# Patient Record
Sex: Male | Born: 1965 | Race: Asian | Hispanic: No | Marital: Married
Health system: Southern US, Community
[De-identification: ages and names within clinical notes are randomized; demographics above are authoritative.]

---

## 2020-05-02 ENCOUNTER — Emergency Department (HOSPITAL_COMMUNITY): Payer: 59

## 2020-05-02 ENCOUNTER — Emergency Department (HOSPITAL_COMMUNITY)
Admission: EM | Admit: 2020-05-02 | Discharge: 2020-05-02 | Disposition: A | Discharge status: Home / Self Care | Payer: 59 | Attending: Emergency Medicine | Admitting: Emergency Medicine

## 2020-05-02 ENCOUNTER — Encounter (HOSPITAL_COMMUNITY): Payer: Self-pay | Admitting: Emergency Medicine

## 2020-05-02 DIAGNOSIS — R1110 Vomiting, unspecified: Secondary | ICD-10-CM | POA: Diagnosis not present

## 2020-05-02 DIAGNOSIS — R197 Diarrhea, unspecified: Secondary | ICD-10-CM | POA: Diagnosis not present

## 2020-05-02 DIAGNOSIS — R1033 Periumbilical pain: Secondary | ICD-10-CM | POA: Diagnosis not present

## 2020-05-02 LAB — URINALYSIS, ROUTINE W REFLEX MICROSCOPIC
Bilirubin Urine: NEGATIVE
Glucose, UA: NEGATIVE mg/dL
Hgb urine dipstick: NEGATIVE
Ketones, ur: NEGATIVE mg/dL
Leukocytes,Ua: NEGATIVE
Nitrite: NEGATIVE
Protein, ur: NEGATIVE mg/dL
Specific Gravity, Urine: 1.023 (ref 1.005–1.030)
pH: 6 (ref 5.0–8.0)

## 2020-05-02 LAB — CBC
HCT: 43 % (ref 39.0–52.0)
Hemoglobin: 14.6 g/dL (ref 13.0–17.0)
MCH: 31.3 pg (ref 26.0–34.0)
MCHC: 34 g/dL (ref 30.0–36.0)
MCV: 92.1 fL (ref 80.0–100.0)
Platelets: 226 10*3/uL (ref 150–400)
RBC: 4.67 MIL/uL (ref 4.22–5.81)
RDW: 11.6 % (ref 11.5–15.5)
WBC: 7.4 10*3/uL (ref 4.0–10.5)
nRBC: 0 % (ref 0.0–0.2)

## 2020-05-02 LAB — COMPREHENSIVE METABOLIC PANEL
ALT: 19 U/L (ref 0–44)
AST: 20 U/L (ref 15–41)
Albumin: 4.4 g/dL (ref 3.5–5.0)
Alkaline Phosphatase: 56 U/L (ref 38–126)
Anion gap: 8 (ref 5–15)
BUN: 10 mg/dL (ref 6–20)
CO2: 24 mmol/L (ref 22–32)
Calcium: 9.1 mg/dL (ref 8.9–10.3)
Chloride: 106 mmol/L (ref 98–111)
Creatinine, Ser: 0.92 mg/dL (ref 0.61–1.24)
GFR calc Af Amer: >60 mL/min (ref >60)
GFR calc non Af Amer: >60 mL/min (ref >60)
Glucose, Bld: 144 mg/dL — ABNORMAL HIGH (ref 70–99)
Potassium: 4.4 mmol/L (ref 3.5–5.1)
Sodium: 138 mmol/L (ref 135–145)
Total Bilirubin: 1.1 mg/dL (ref 0.3–1.2)
Total Protein: 7 g/dL (ref 6.5–8.1)

## 2020-05-02 LAB — LIPASE, BLOOD: Lipase: 25 U/L (ref 11–51)

## 2020-05-02 MED ORDER — IOHEXOL 300 MG/ML  SOLN
100.0000 mL | Freq: Once | INTRAMUSCULAR | Status: AC | PRN
  Administered 2020-05-02: 100 mL via INTRAVENOUS

## 2020-05-02 MED ORDER — DICYCLOMINE HCL 10 MG PO CAPS
10.0000 mg | ORAL_CAPSULE | Freq: Once | ORAL | Status: AC
  Administered 2020-05-02: 10 mg via ORAL
  Filled 2020-05-02: qty 1

## 2020-05-02 MED ORDER — DICYCLOMINE HCL 20 MG PO TABS: 20.0000 mg | ORAL_TABLET | Freq: Two times a day (BID) | ORAL | 0 refills | Status: AC | PRN

## 2020-05-02 MED ORDER — ONDANSETRON HCL 4 MG/2ML IJ SOLN
4.0000 mg | Freq: Once | INTRAMUSCULAR | Status: AC
  Administered 2020-05-02: 4 mg via INTRAVENOUS
  Filled 2020-05-02: qty 2

## 2020-05-02 NOTE — ED Triage Notes (Signed)
Pt reports lower abd pain with diarrhea, onset this am, denies vomiting or recent sick contacts.

## 2020-05-02 NOTE — ED Notes (Signed)
Patient transported to CT 

## 2020-05-02 NOTE — ED Provider Notes (Signed)
[facility] EMERGENCY DEPARTMENT Provider Note   CSN: [id number] Arrival date & time: 05/02/20  4128     History Chief Complaint  Patient presents with  . Abdominal Pain    Jason Kane is a 54 y.o. male presenting for evaluation of abdominal pain, diarrhea, and vomiting.  Patient states he developed lower abdominal pain last night around midnight.  He had several episodes of diarrhea, 5-10 throughout the course of the night without improvement of the abdominal pain.  He denies blood in his stool.  His stool is not black.  He reports one mild episode of emesis, however denies nausea.  He denies fevers, chills, chest pain, shortness breath, cough, urinary symptoms.  He reports history of similar, but states his abdominal pain usually resolves after he has one episode of diarrhea, but did not do so today.  He has never had any abdominal surgeries.  He has never seen GI before.  He has no medical problems, takes medications daily.  He has not taken anything for his symptoms.  He denies sick contacts.  HPI     History reviewed. No pertinent past medical history.  There are no problems to display for this patient.   History reviewed. No pertinent surgical history.     No family history on file.  Social History   Tobacco Use  . Smoking status: Not on file  Substance Use Topics  . Alcohol use: Not on file  . Drug use: Not on file    Home Medications Prior to Admission medications   Medication Sig Start Date End Date Taking? Authorizing Provider  dicyclomine (BENTYL) 20 MG tablet Take 1 tablet (20 mg total) by mouth 2 (two) times daily as needed for spasms. 05/02/20   Elmar Antigua, PA-C    Allergies    Patient has no known allergies.  Review of Systems   Review of Systems  Gastrointestinal: Positive for abdominal pain, diarrhea and vomiting.  All other systems reviewed and are negative.   Physical Exam Updated Vital Signs BP 124/81   Pulse 75    Temp 97.8 F (36.6 C) (Oral)   Resp 18   SpO2 100%   Physical Exam Vitals and nursing note reviewed.  Constitutional:      General: He is not in acute distress.    Appearance: He is well-developed.     Comments: Appears nontoxic  HENT:     Head: Normocephalic and atraumatic.  Eyes:     Conjunctiva/sclera: Conjunctivae normal.     Pupils: Pupils are equal, round, and reactive to light.  Cardiovascular:     Rate and Rhythm: Normal rate and regular rhythm.     Pulses: Normal pulses.  Pulmonary:     Effort: Pulmonary effort is normal. No respiratory distress.     Breath sounds: Normal breath sounds. No wheezing.  Abdominal:     General: There is no distension.     Palpations: Abdomen is soft. There is no mass.     Tenderness: There is abdominal tenderness. There is no guarding or rebound.     Comments: TTP of the abdomen, mostly periumbilical although mild in bilateral lower quadrants.  No rigidity, guarding, distention.  Negative rebound.  No peritonitis.  Musculoskeletal:        General: Normal range of motion.     Cervical back: Normal range of motion and neck supple.  Skin:    General: Skin is warm and dry.     Capillary Refill: Capillary refill  takes less than 2 seconds.  Neurological:     Mental Status: He is alert and oriented to person, place, and time.     ED Results / Procedures / Treatments   Labs (all labs ordered are listed, but only abnormal results are displayed) Labs Reviewed  COMPREHENSIVE METABOLIC PANEL - Abnormal; Notable for the following components:      Result Value   Glucose, Bld 144 (*)    All other components within normal limits  LIPASE, BLOOD  CBC  URINALYSIS, ROUTINE W REFLEX MICROSCOPIC    EKG None  Radiology CT ABDOMEN PELVIS W CONTRAST  Result Date: 05/02/2020 CLINICAL DATA:  Abdominal pain and diarrhea since this morning. EXAM: CT ABDOMEN AND PELVIS WITH CONTRAST TECHNIQUE: Multidetector CT imaging of the abdomen and pelvis was  performed using the standard protocol following bolus administration of intravenous contrast. CONTRAST:  OMNIPAQUE IOHEXOL 300 MG/ML  SOLN COMPARISON:  None. FINDINGS: Lower chest: The lung bases are clear of acute process. No pleural effusion or pulmonary lesions. The heart is normal in size. No pericardial effusion. The distal esophagus and aorta are unremarkable. Hepatobiliary: No focal hepatic lesions or intrahepatic biliary dilatation. The gallbladder is normal. No common bile duct dilatation. Pancreas: No mass, inflammation or ductal dilatation. Spleen: Normal size.  No focal lesions. Adrenals/Urinary Tract: The adrenal glands are normal. Both kidneys demonstrate normal perfusion/enhancement. No renal lesions or hydroureteronephrosis. No renal or obstructing ureteral calculi. The bladder is unremarkable. Stomach/Bowel: The stomach, duodenum, small bowel and colon are grossly normal without oral contrast. No acute inflammatory changes, mass lesions or obstructive findings. The terminal ileum is normal. The appendix is normal. Vascular/Lymphatic: The aorta is normal in caliber. No dissection. Scattered atherosclerotic calcifications. The branch vessels are patent. The major venous structures are patent. No mesenteric or retroperitoneal mass or adenopathy. Small scattered lymph nodes are noted. Reproductive: The prostate gland and seminal vesicles are unremarkable. Other: No pelvic mass or adenopathy. No free pelvic fluid collections. No inguinal mass or adenopathy. No abdominal wall hernia or subcutaneous lesions. Musculoskeletal: No significant bony findings. IMPRESSION: No acute abdominal/pelvic findings, mass lesions or adenopathy. Electronically Signed   By: Rudie Meyer M.D.   On: 05/02/2020 12:56    Procedures Procedures (including critical care time)  Medications Ordered in ED Medications  ondansetron (ZOFRAN) injection 4 mg (4 mg Intravenous Given 05/02/20 1104)  dicyclomine (BENTYL)  capsule 10 mg (10 mg Oral Given 05/02/20 1104)  iohexol (OMNIPAQUE) 300 MG/ML solution 100 mL (100 mLs Intravenous Contrast Given 05/02/20 1246)    ED Course  I have reviewed the triage vital signs and the nursing notes.  Pertinent labs & imaging results that were available during my care of the patient were reviewed by me and considered in my medical decision making (see chart for details).    MDM Rules/Calculators/A&P                          Patient presenting for evaluation of lower abdominal pain and diarrhea.  On exam, patient appears nontoxic.  He has periumbilical pain, however also pain in bilateral lower abdomen.  Considering patient's age, favor diverticulitis.  Less likely appendicitis, however patient does have periumbilical pain.  Consider viral GI illness.  Will give Zofran and Bentyl.  CT abdomen pelvis and labs pending.  Labs interpreted by me, overall reassuring. No leukocytosis. Electrolytes stable. Kidney, liver kidney pancreatic function normal. Urine without infection. CT pending.  CT abdomen pelvis  negative for acute findings. On reassessment after medications, patient reports symptoms are much improved. He is feeling well at this time. I discussed possible viral illness, and symptomatic treatment with bentyl and food choices. Encouraged follow-up with PCP symptoms not proving. At this time, patient appears safe for discharge. Return precautions given. Patient states he understands and agrees to plan.  Final Clinical Impression(s) / ED Diagnoses Final diagnoses:  Periumbilical abdominal pain  Diarrhea, unspecified type    Rx / DC Orders ED Discharge Orders         Ordered    dicyclomine (BENTYL) 20 MG tablet  2 times daily PRN        05/02/20 1311           Gryffin Altice, PA-C 05/02/20 1338    Curatolo, Adam, DO 05/02/20 1515

## 2020-05-02 NOTE — Discharge Instructions (Addendum)
Use Bentyl as needed for abdominal cramping or pain. Make sure you are staying well-hydrated water. There is information paperwork about food choices that may affect your diarrhea. Return to the emergency room if you develop high fevers, severe worsening abdominal pain, blood in your stool, or any new, worsening, or concerning symptoms.

## 2021-09-02 IMAGING — CT CT ABD-PELV W/ CM
2 of 4 series · 16 of 46 positions shown, 18 images · IV contrast (omnipaque)
Comparison: None.

CLINICAL DATA: Abdominal pain and diarrhea since this morning.

EXAM:
CT ABDOMEN AND PELVIS WITH CONTRAST
TECHNIQUE: Multidetector CT imaging of the abdomen and pelvis was performed
using the standard protocol following bolus administration of
intravenous contrast.
CONTRAST:  100mL OMNIPAQUE IOHEXOL 300 MG/ML  SOLN

[Series 2: a/p w/ 5mm · axial · 0.98mm/px · z∈[+983,+1453]mm · 13 of 104 slices shown, 15 images]
[im 5/104  soft-tissue]
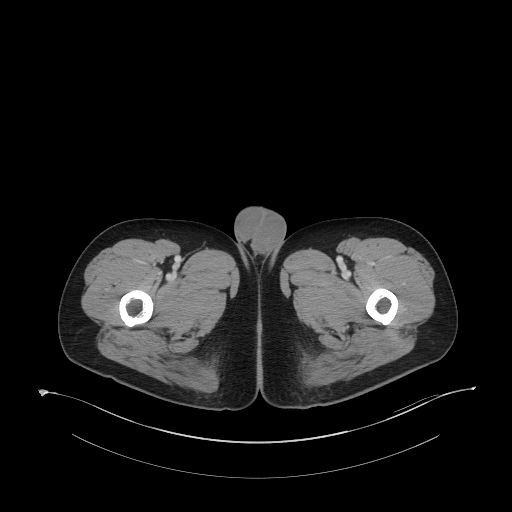
[im 5/104  bone]
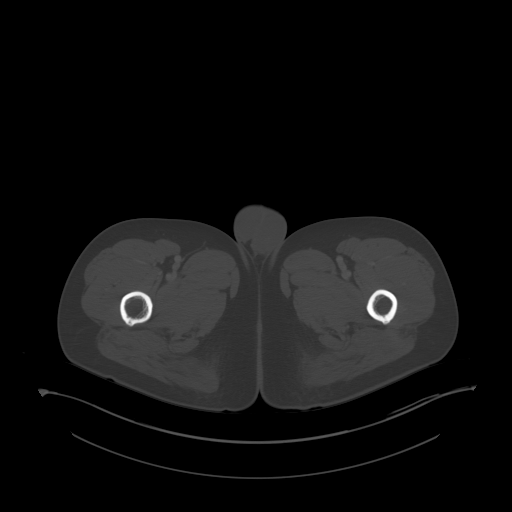
[im 13/104  soft-tissue]
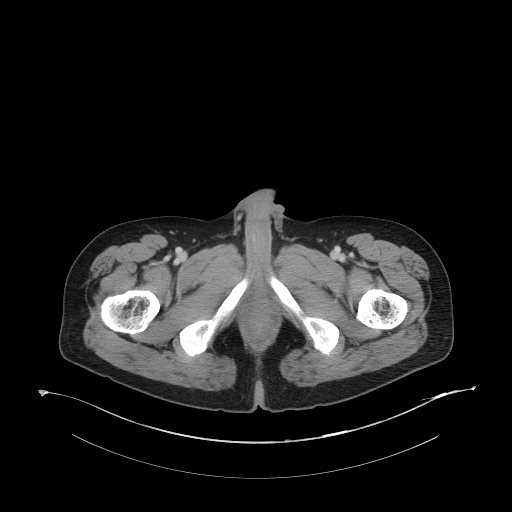
[im 22/104  soft-tissue]
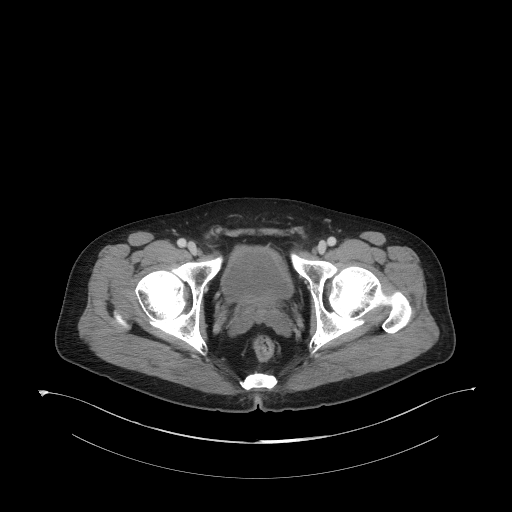
[im 31/104  soft-tissue]
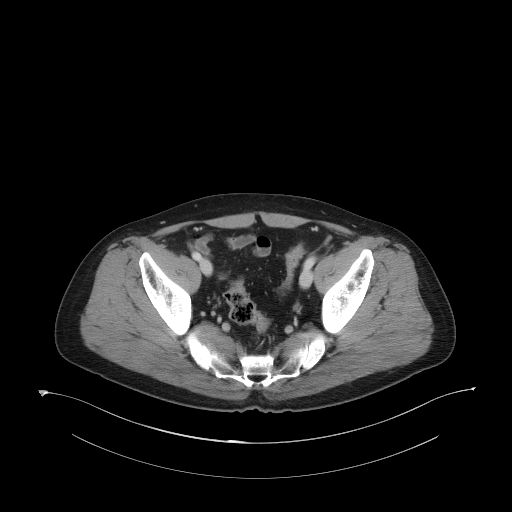
[im 35/104  soft-tissue]
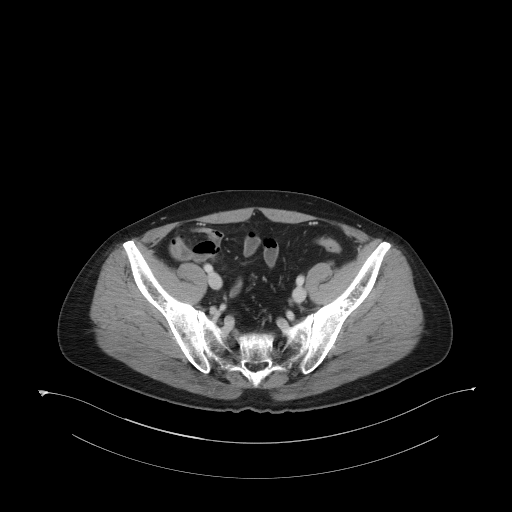
[im 43/104  soft-tissue]
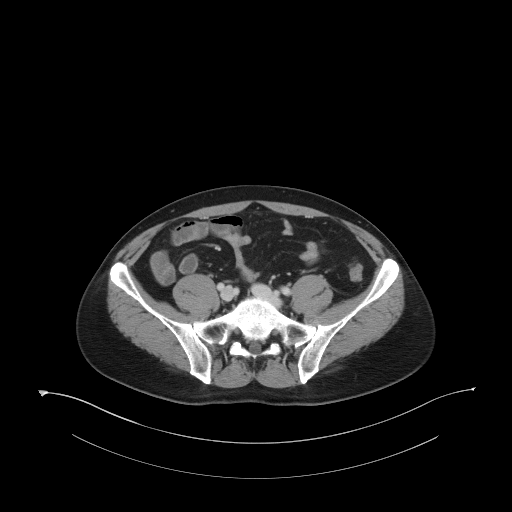
[im 52/104  soft-tissue]
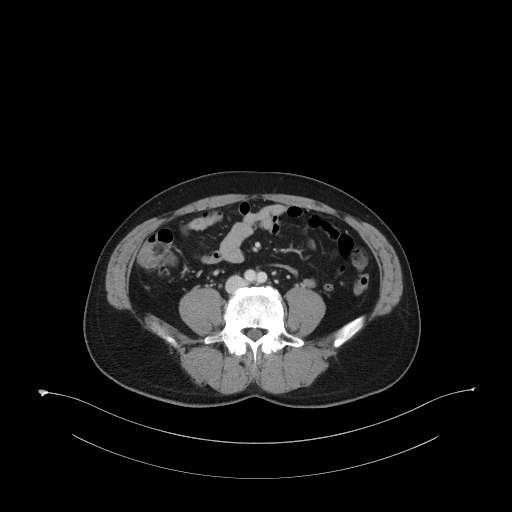
[im 61/104  soft-tissue]
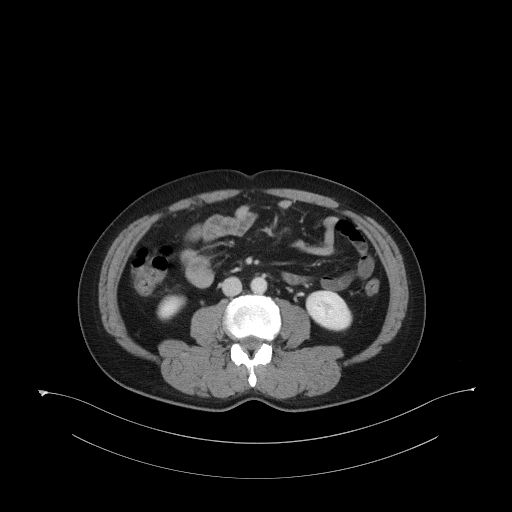
[im 69/104  soft-tissue]
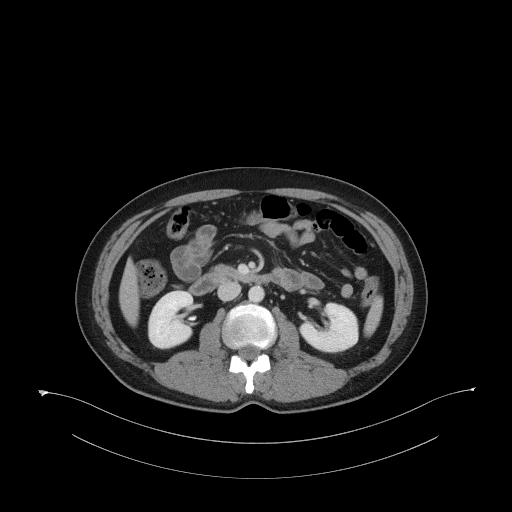
[im 69/104  bone]
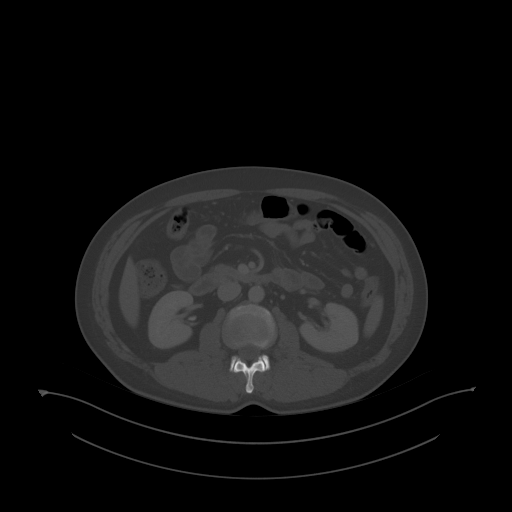
[im 73/104  soft-tissue]
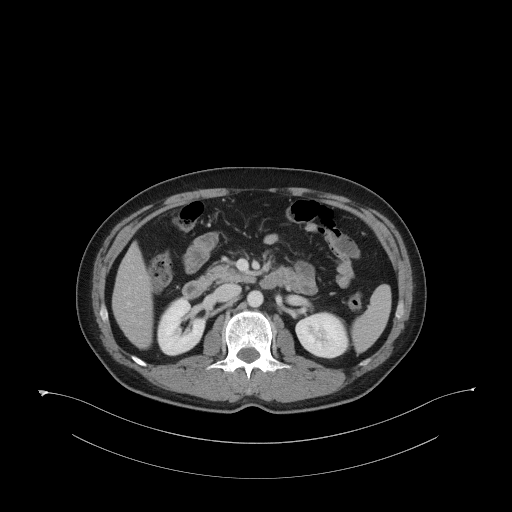
[im 82/104  soft-tissue]
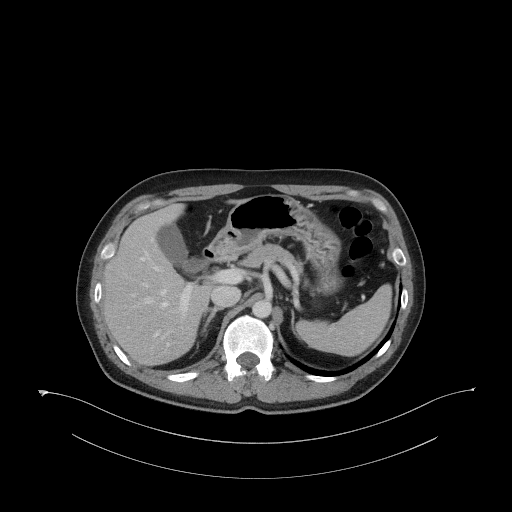
[im 91/104  soft-tissue]
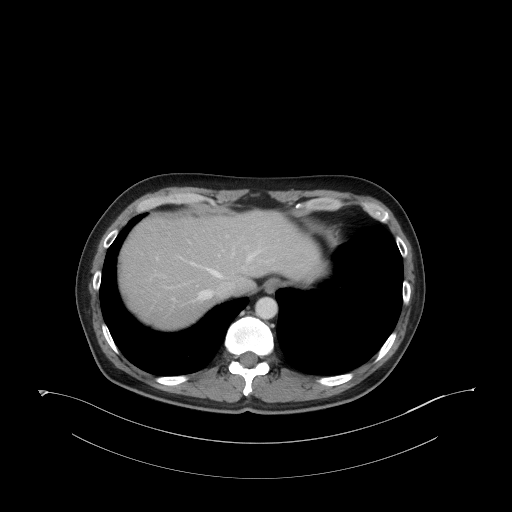
[im 99/104  soft-tissue]
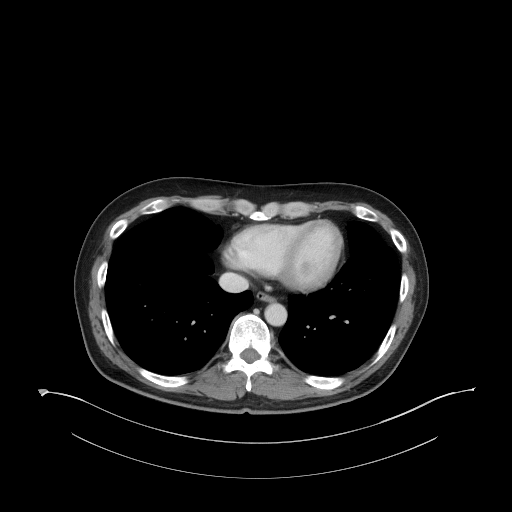

[Series 5: a/p w/ cor · coronal · 0.78mm/px · 3 of 151 slices shown]
[im 51/151  soft-tissue]
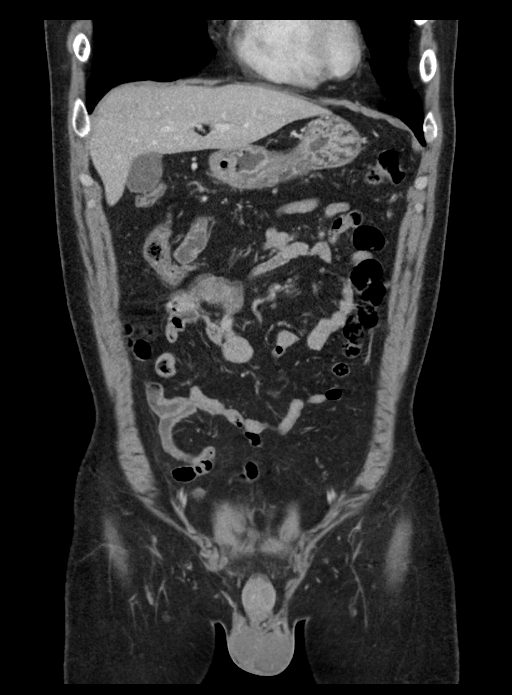
[im 67/151  soft-tissue]
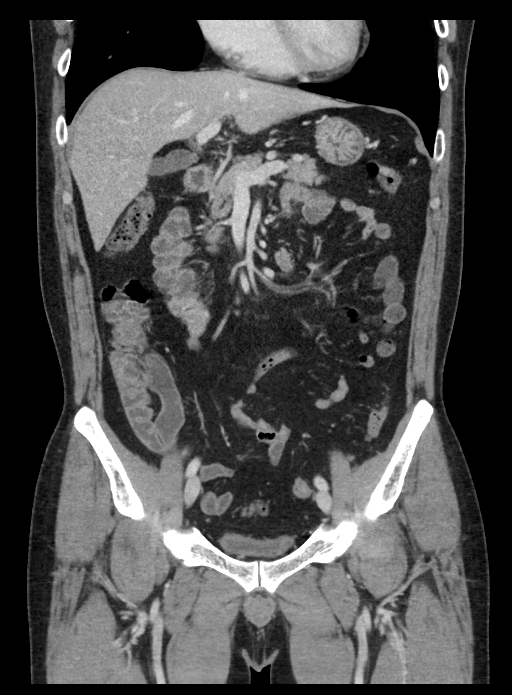
[im 84/151  soft-tissue]
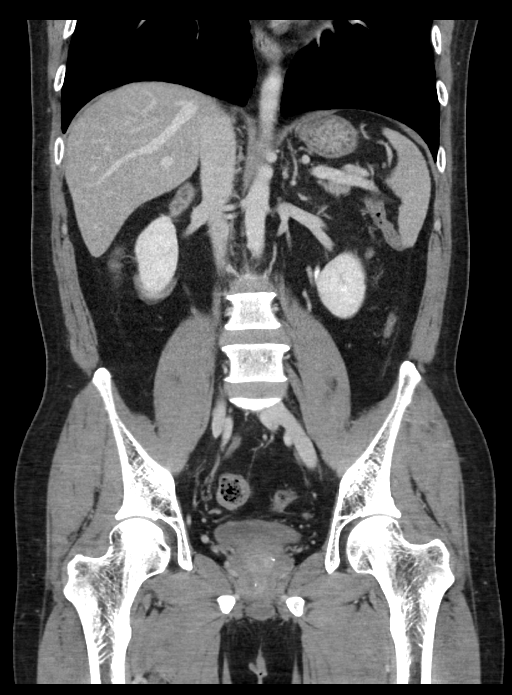

[16 of 46 positions shown; findings below may reference images not displayed]

FINDINGS: Lower chest: The lung bases are clear of acute process. No pleural
effusion or pulmonary lesions. The heart is normal in size. No
pericardial effusion. The distal esophagus and aorta are
unremarkable.

Hepatobiliary: No focal hepatic lesions or intrahepatic biliary
dilatation. The gallbladder is normal. No common bile duct
dilatation.

Pancreas: No mass, inflammation or ductal dilatation.

Spleen: Normal size.  No focal lesions.

Adrenals/Urinary Tract: The adrenal glands are normal.

Both kidneys demonstrate normal perfusion/enhancement. No renal
lesions or hydroureteronephrosis. No renal or obstructing ureteral
calculi. The bladder is unremarkable.

Stomach/Bowel: The stomach, duodenum, small bowel and colon are
grossly normal without oral contrast. No acute inflammatory changes,
mass lesions or obstructive findings. The terminal ileum is normal.
The appendix is normal.

Vascular/Lymphatic: The aorta is normal in caliber. No dissection.
Scattered atherosclerotic calcifications. The branch vessels are
patent. The major venous structures are patent. No mesenteric or
retroperitoneal mass or adenopathy. Small scattered lymph nodes are
noted.

Reproductive: The prostate gland and seminal vesicles are
unremarkable.

Other: No pelvic mass or adenopathy. No free pelvic fluid
collections. No inguinal mass or adenopathy. No abdominal wall
hernia or subcutaneous lesions.

Musculoskeletal: No significant bony findings.
IMPRESSION: No acute abdominal/pelvic findings, mass lesions or adenopathy.
